# Patient Record
Sex: Male | Born: 1935 | Race: White | Hispanic: No | Marital: Married
Health system: Southern US, Community
[De-identification: ages and names within clinical notes are randomized; demographics above are authoritative.]

---

## 2003-07-28 ENCOUNTER — Other Ambulatory Visit: Payer: Self-pay

## 2005-02-09 ENCOUNTER — Ambulatory Visit: Payer: Self-pay | Admitting: Surgery

## 2006-03-16 ENCOUNTER — Ambulatory Visit: Payer: Self-pay | Admitting: Rheumatology

## 2006-03-19 ENCOUNTER — Encounter: Payer: Self-pay | Admitting: Rheumatology

## 2006-03-31 ENCOUNTER — Encounter: Payer: Self-pay | Admitting: Rheumatology

## 2006-04-26 ENCOUNTER — Encounter: Payer: Self-pay | Admitting: Rheumatology

## 2006-04-30 ENCOUNTER — Encounter: Payer: Self-pay | Admitting: Rheumatology

## 2006-05-23 ENCOUNTER — Inpatient Hospital Stay: Payer: Self-pay | Admitting: Internal Medicine

## 2006-05-23 ENCOUNTER — Other Ambulatory Visit: Payer: Self-pay

## 2006-07-16 ENCOUNTER — Ambulatory Visit: Payer: Self-pay | Admitting: Surgery

## 2008-05-27 ENCOUNTER — Ambulatory Visit: Payer: Self-pay | Admitting: Internal Medicine

## 2008-09-28 ENCOUNTER — Ambulatory Visit: Payer: Self-pay | Admitting: Vascular Surgery

## 2010-03-21 ENCOUNTER — Inpatient Hospital Stay: Payer: Self-pay | Admitting: Specialist

## 2010-05-29 ENCOUNTER — Inpatient Hospital Stay: Payer: Self-pay | Admitting: Internal Medicine

## 2010-12-13 ENCOUNTER — Inpatient Hospital Stay: Payer: Self-pay | Admitting: Internal Medicine

## 2011-04-04 ENCOUNTER — Inpatient Hospital Stay: Payer: Self-pay | Admitting: Internal Medicine

## 2011-04-05 ENCOUNTER — Ambulatory Visit: Payer: Self-pay | Admitting: Internal Medicine

## 2011-05-01 ENCOUNTER — Ambulatory Visit: Payer: Self-pay | Admitting: Internal Medicine

## 2011-05-01 DEATH — deceased

## 2012-08-06 IMAGING — CT CT HEAD WITHOUT CONTRAST
2 series · 15 of 30 positions shown, 19 images · non-contrast
Comparison: none

REASON FOR EXAM: confusion, ams
COMMENTS:

PROCEDURE:     CT  - CT HEAD WITHOUT CONTRAST  - December 13, 2010  [DATE]
RESULT:     Head CT dated 12/13/2010 a comparison was made to a prior study
dated 03/20/2010.
TECHNIQUE: Helical noncontrasted 5 mm sections were obtained from the skull
base through the vertex.

[Series 2: without · axial · non-contrast · 0.43mm/px · z∈[+878,+998]mm · 13 of 30 slices shown, 17 images]
[im 3/30  brain]
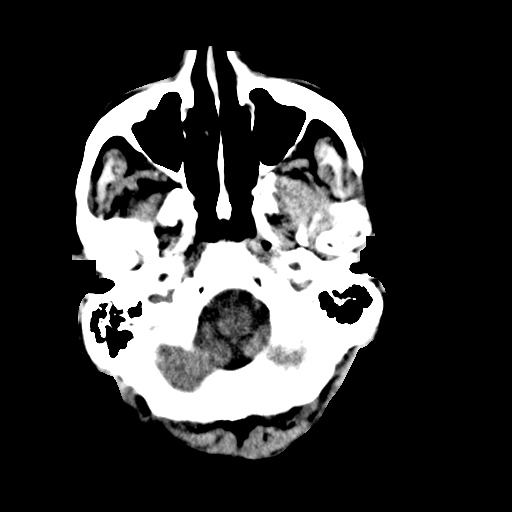
[im 3/30  bone]
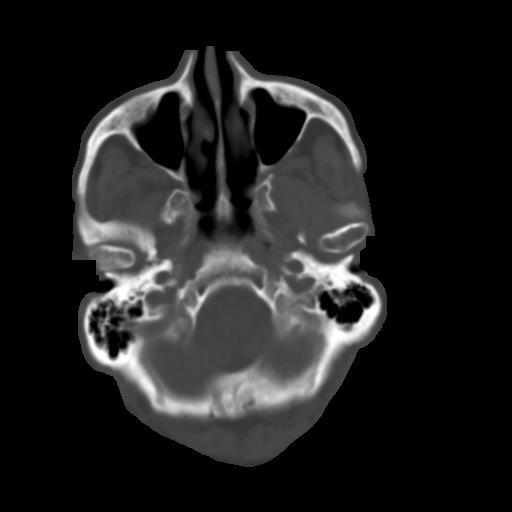
[im 5/30  brain]
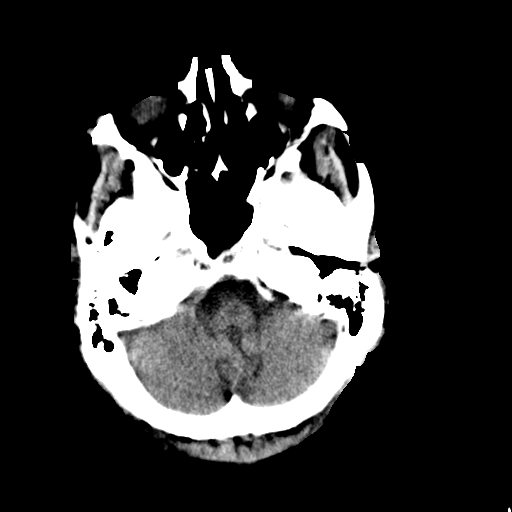
[im 7/30  brain]
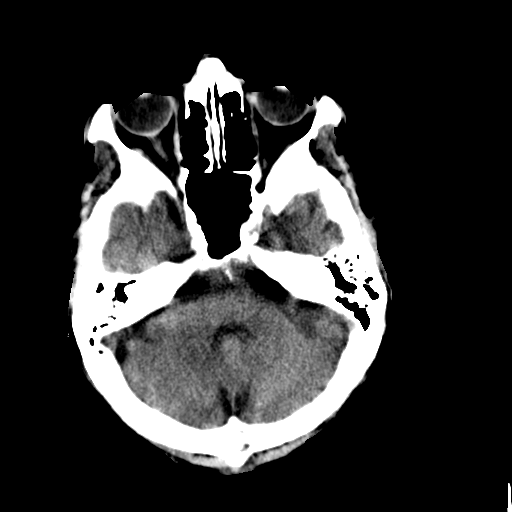
[im 9/30  brain]
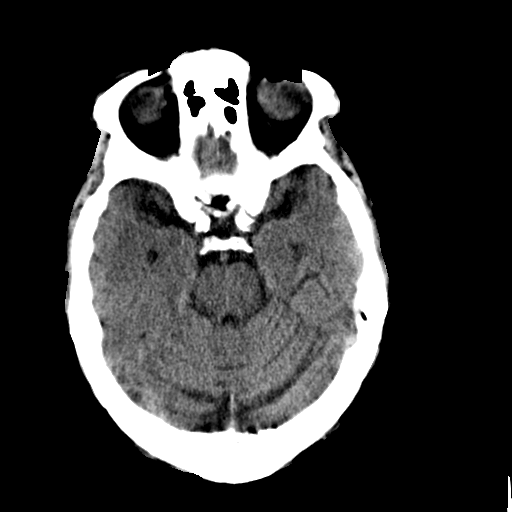
[im 11/30  brain]
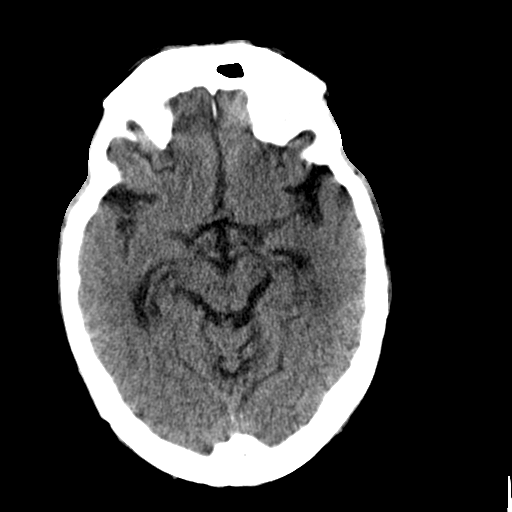
[im 11/30  bone]
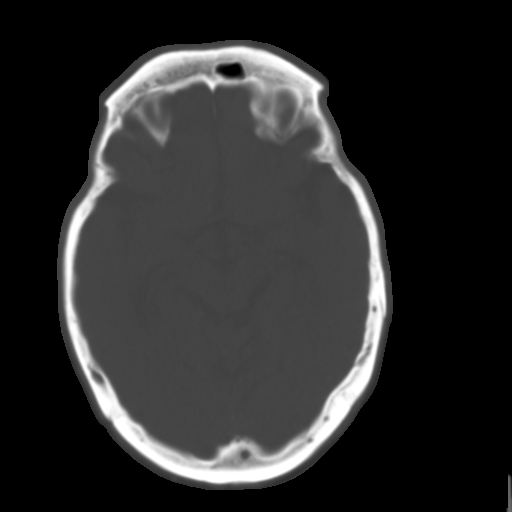
[im 13/30  brain]
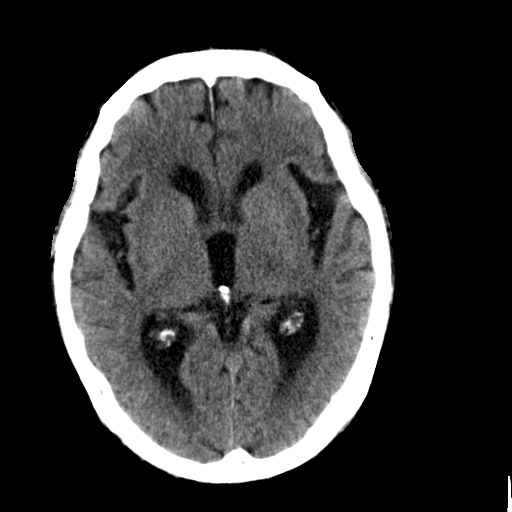
[im 15/30  brain]
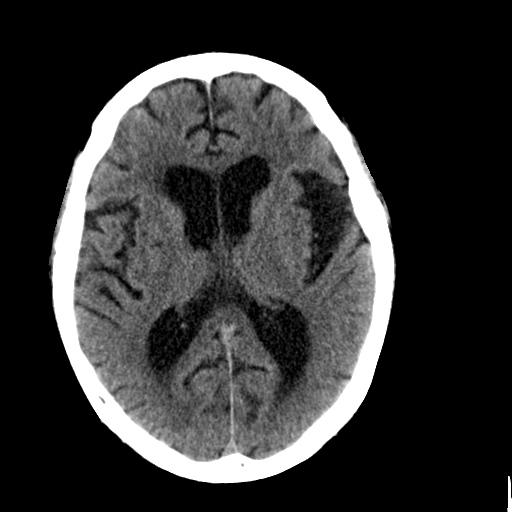
[im 17/30  brain]
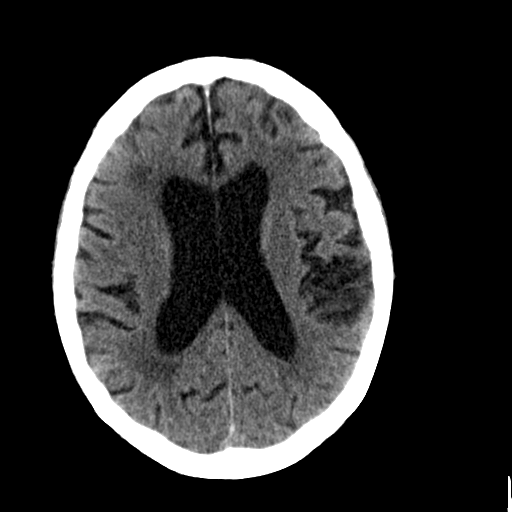
[im 19/30  brain]
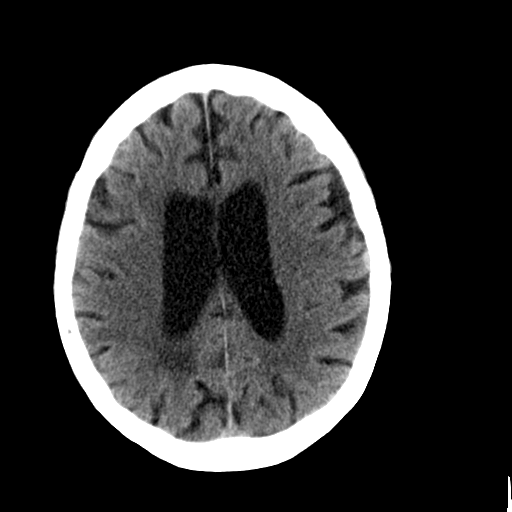
[im 19/30  bone]
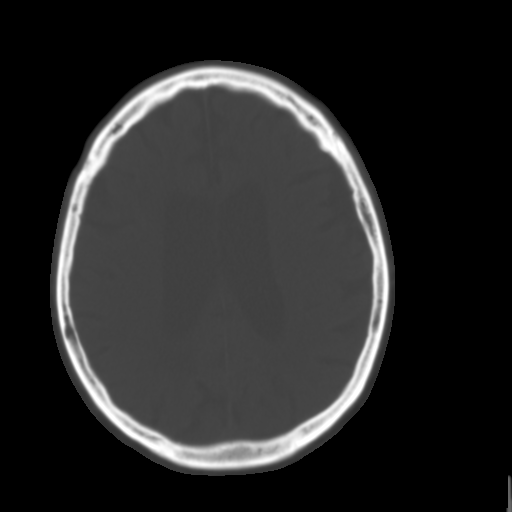
[im 21/30  brain]
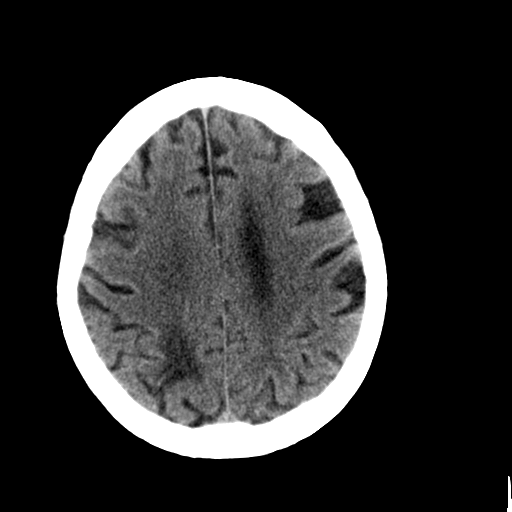
[im 23/30  brain]
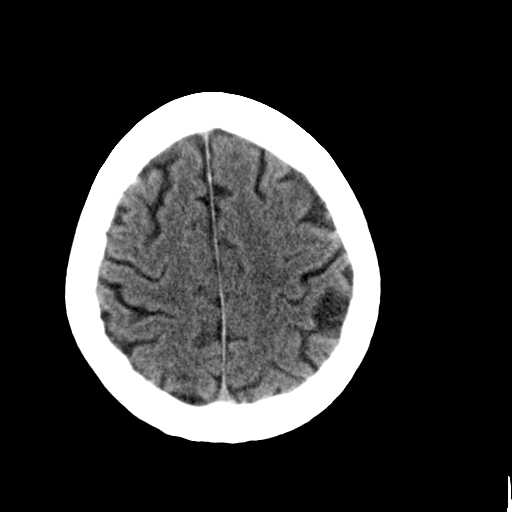
[im 25/30  brain]
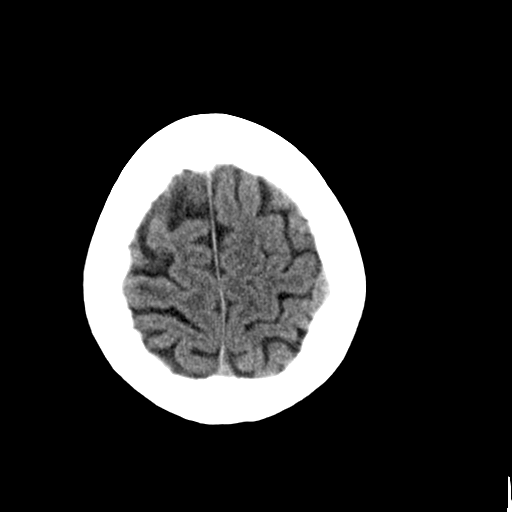
[im 27/30  brain]
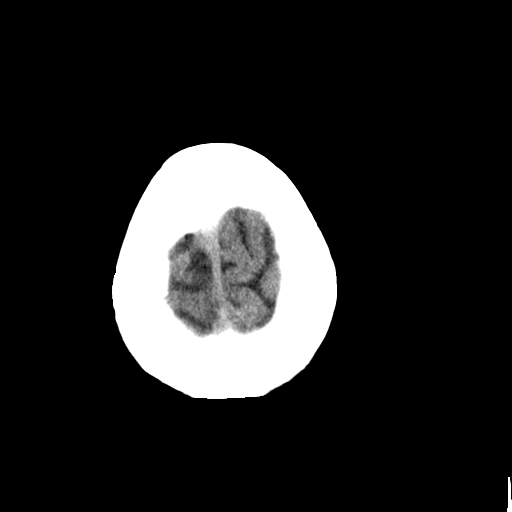
[im 27/30  bone]
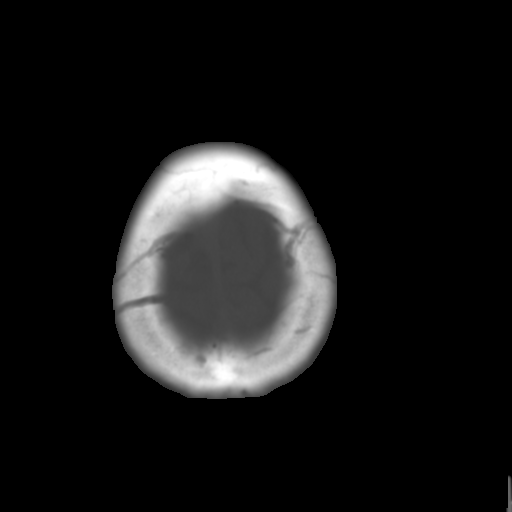

[Series 3: bone · axial · 0.43mm/px · z∈[+878,+898]mm · 2 of 30 slices shown]
[im 3/30  bone]
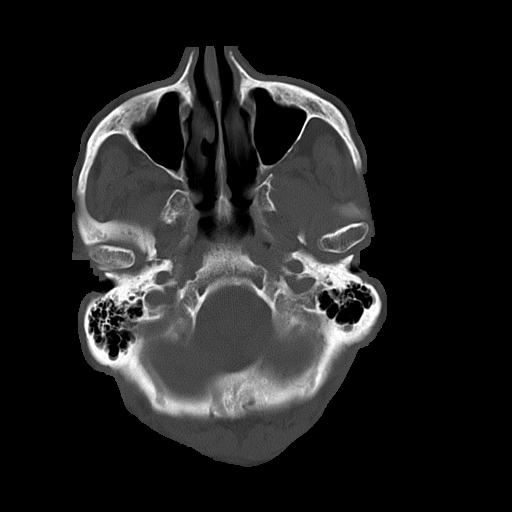
[im 7/30  bone]
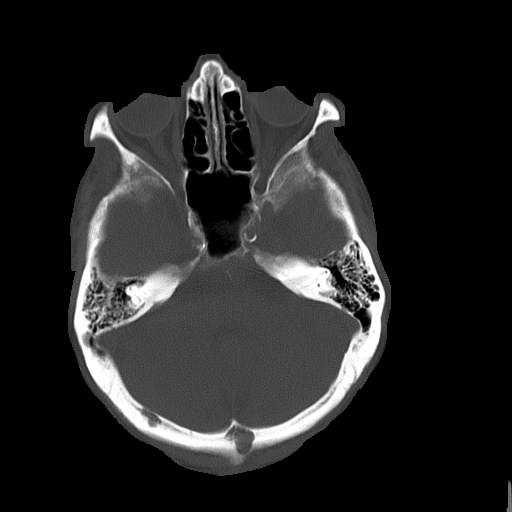

[15 of 30 positions shown; findings below may reference images not displayed]

FINDINGS: Areas of low attenuation project within the subcortical, deep, and
periventricular white matter regions. A focal area of low-attenuation
projects within the parietal occipital region on the right. This finding is
consistent with an area of chronic infarction. There is mild hydrocephalus
ex vacuo. There is mild diffuse cortical and cerebellar atrophy. There is no
evidence of subfalcine or tonsillar herniation, acute hemorrhage, nor
intra-axial or extra-axial fluid collections. There is no evidence of a
depressed skull fracture.
IMPRESSION: Involutional and chronic changes as described above without
evidence of acute abnormalities.

## 2012-11-27 IMAGING — CR DG CHEST 2V
1 series · 3 of 3 positions shown · non-contrast
Comparison: none

REASON FOR EXAM: sob
COMMENTS:

PROCEDURE:     DXR - DXR CHEST PA (OR AP) AND LATERAL  - April 05, 2011 [DATE]
RESULT:     Comparison: 04/04/2011

[Series 1: view not recorded · 0.17mm/px · 3 of 3 slices shown]
[im 1/3]
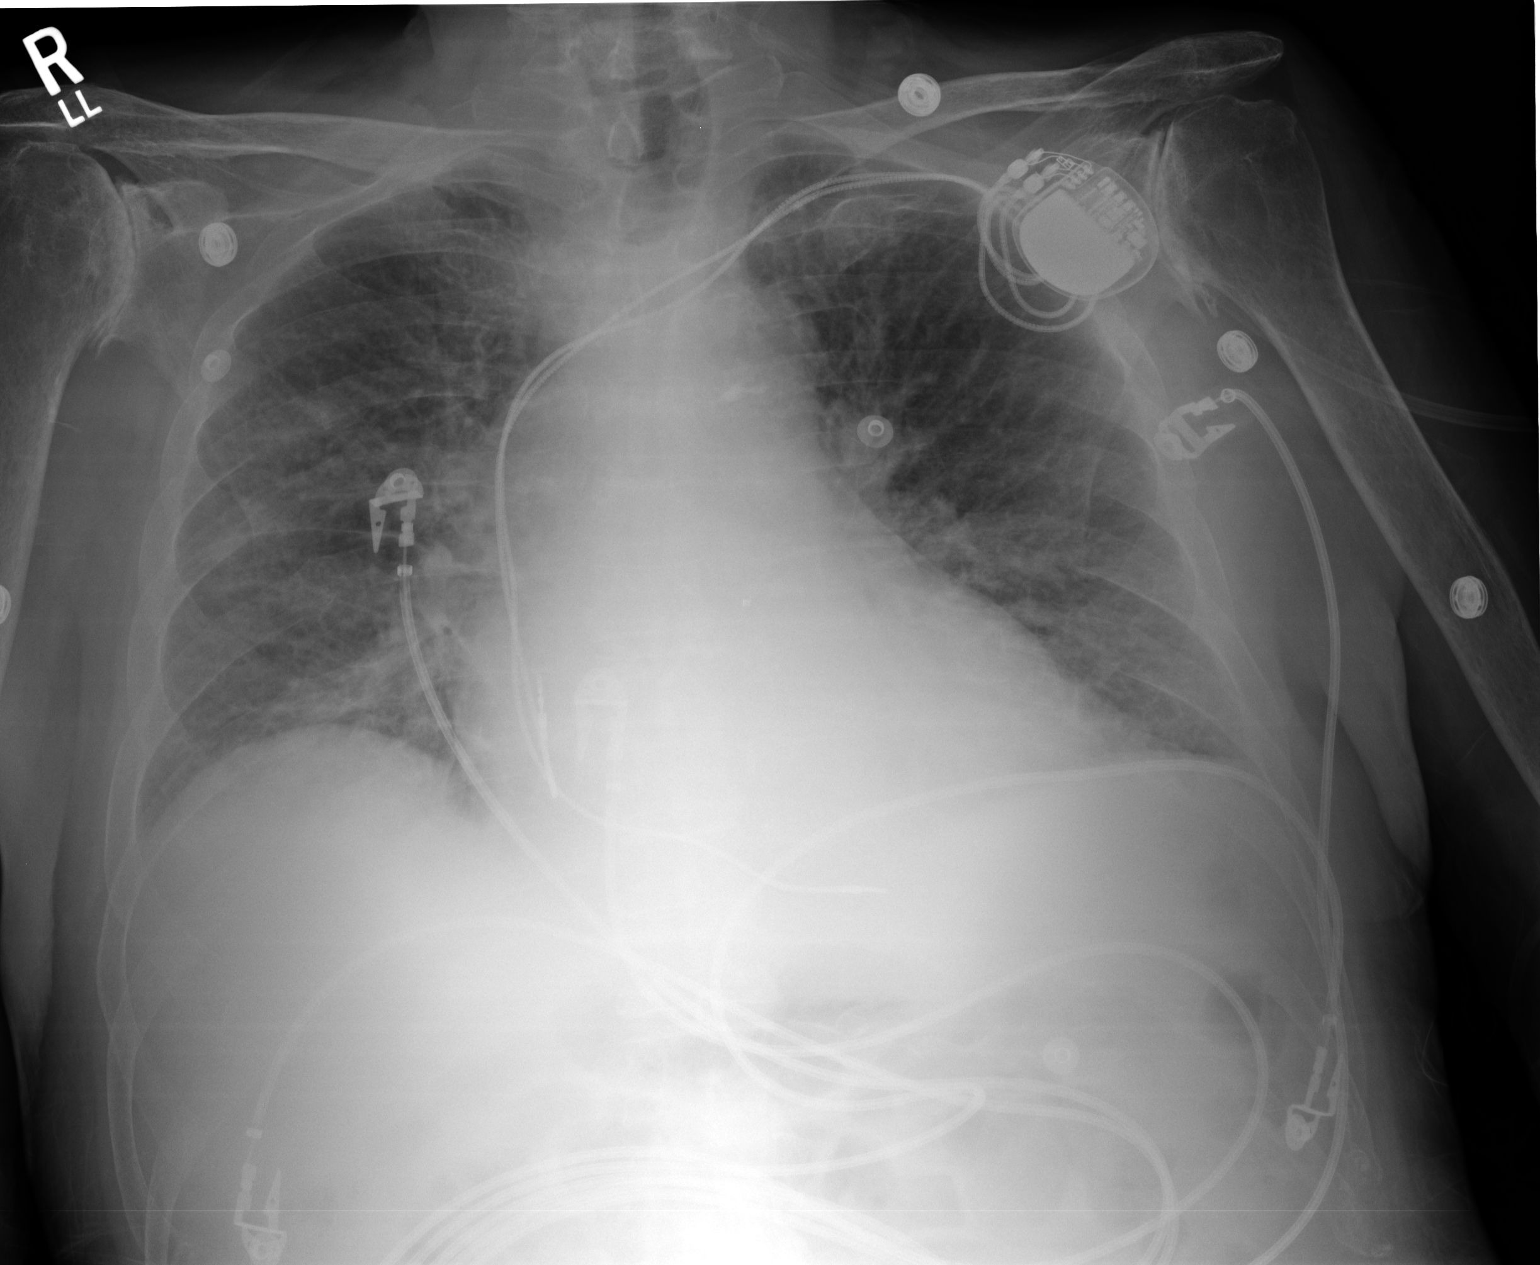
[im 2/3]
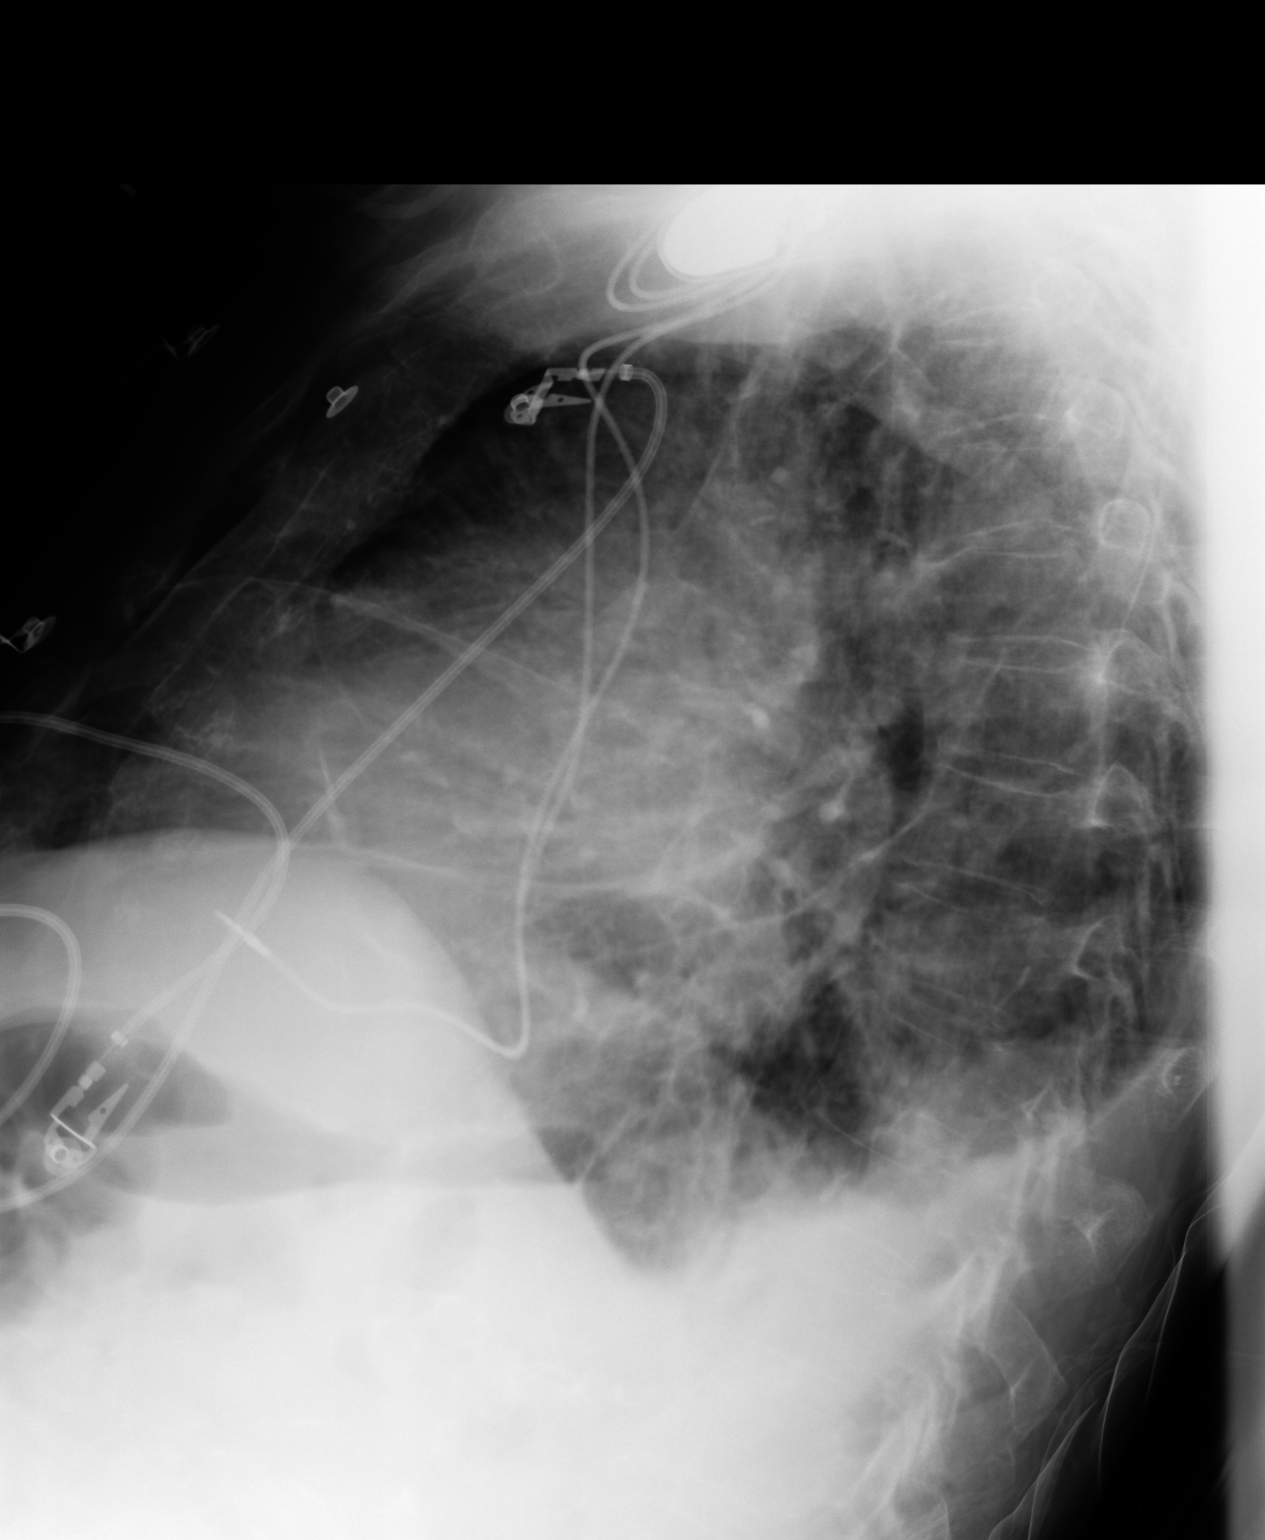
[im 3/3]
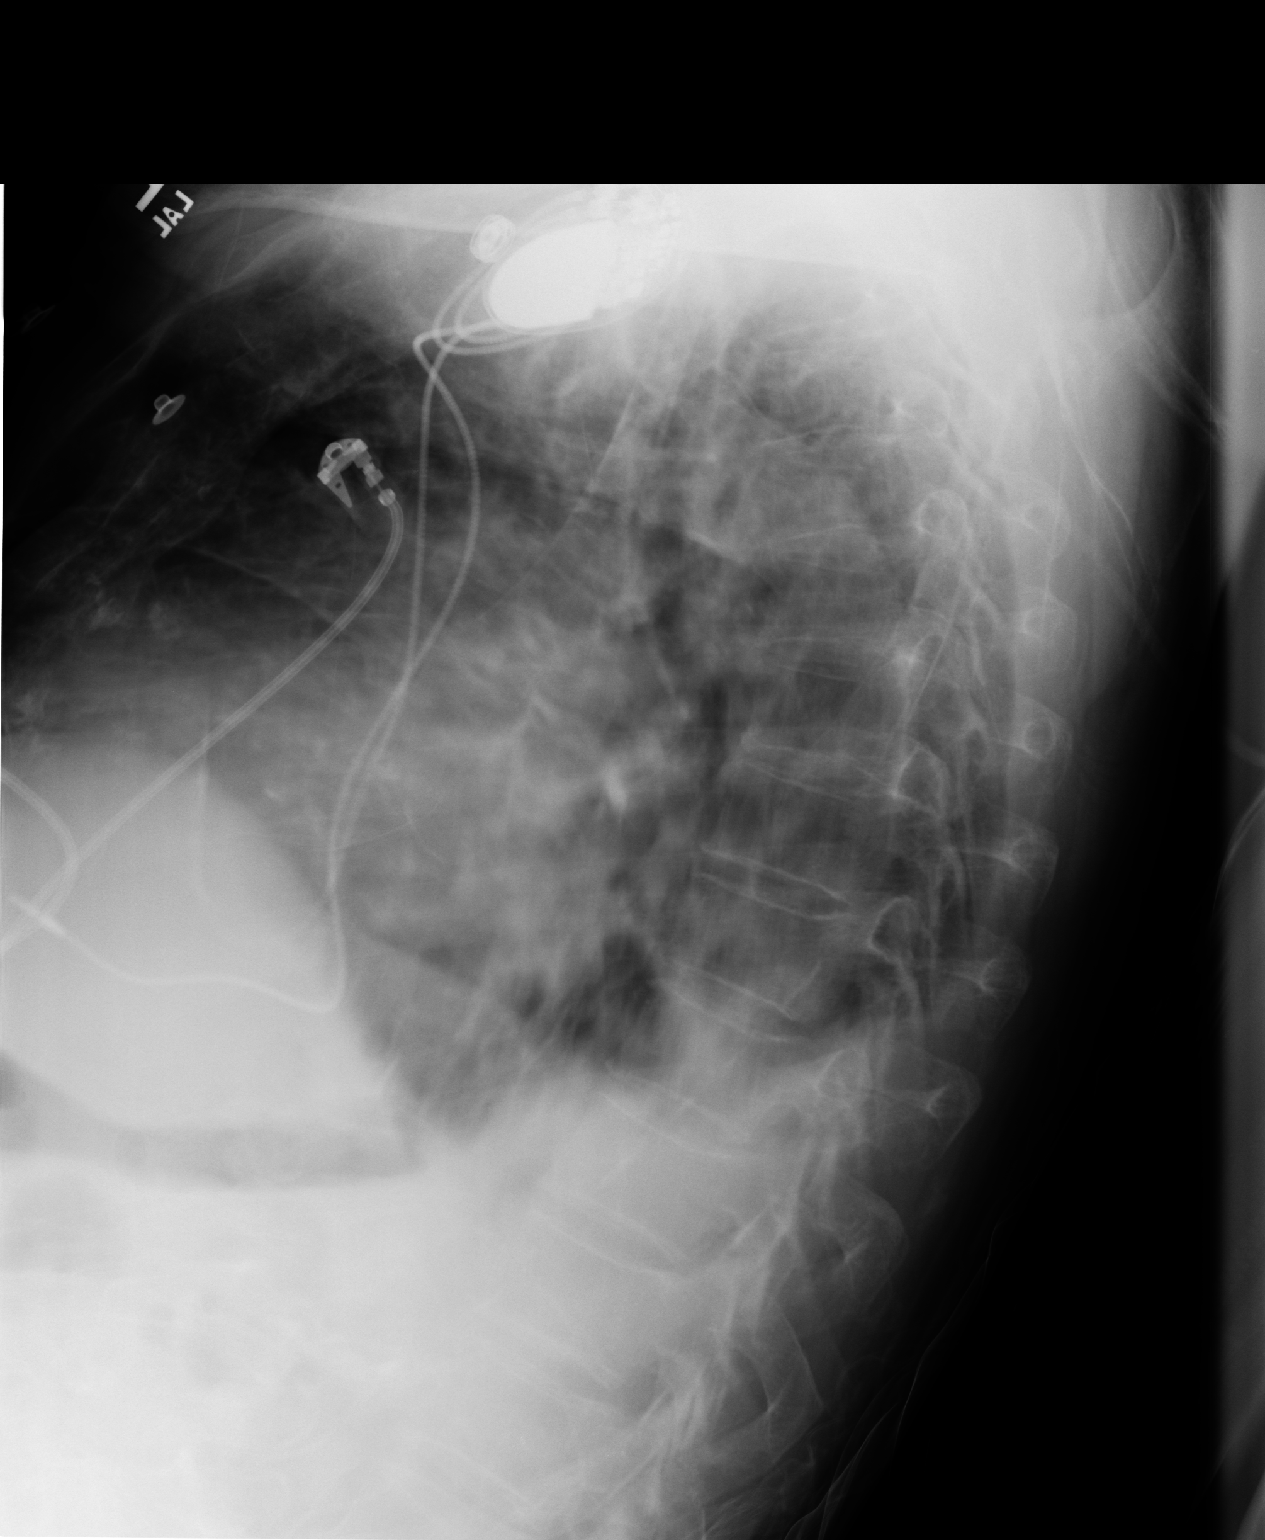

[3 of 3 positions shown; findings below may reference images not displayed]

FINDINGS: PA and lateral chest radiographs are provided. There is diffuse bilateral
interstitial thickening. There is no focal parenchymal opacity, pleural
effusion, or pneumothorax. The heart size is enlarged. There is a dual-lead
cardiac pacer noted. A there are degenerative changes of bilateral
glenohumeral joints.
IMPRESSION: Overall findings are concerning for mild pulmonary edema.

## 2015-11-09 ENCOUNTER — Encounter: Payer: Self-pay | Admitting: *Deleted

## 2016-03-29 NOTE — Telephone Encounter (Signed)
This encounter was created in error - please disregard.
